# Patient Record
Sex: Female | Born: 1995 | Hispanic: Yes | Marital: Married | Smoking: Never smoker
Health system: Southern US, Community
[De-identification: ages and names within clinical notes are randomized; demographics above are authoritative.]

---

## 2021-04-19 ENCOUNTER — Emergency Department (HOSPITAL_COMMUNITY)
Admission: EM | Admit: 2021-04-19 | Discharge: 2021-04-20 | Disposition: A | Discharge status: Home / Self Care | Payer: No Typology Code available for payment source | Attending: Emergency Medicine | Admitting: Emergency Medicine

## 2021-04-19 ENCOUNTER — Encounter (HOSPITAL_COMMUNITY): Payer: Self-pay | Admitting: Emergency Medicine

## 2021-04-19 ENCOUNTER — Other Ambulatory Visit: Payer: Self-pay

## 2021-04-19 DIAGNOSIS — R109 Unspecified abdominal pain: Secondary | ICD-10-CM | POA: Insufficient documentation

## 2021-04-19 LAB — I-STAT BETA HCG BLOOD, ED (MC, WL, AP ONLY): I-stat hCG, quantitative: <5 m[IU]/mL (ref <5)

## 2021-04-19 LAB — URINALYSIS, ROUTINE W REFLEX MICROSCOPIC
Bacteria, UA: NONE SEEN
Bilirubin Urine: NEGATIVE
Glucose, UA: NEGATIVE mg/dL
Ketones, ur: NEGATIVE mg/dL
Leukocytes,Ua: NEGATIVE
Nitrite: NEGATIVE
Protein, ur: 30 mg/dL — AB
Specific Gravity, Urine: 1.014 (ref 1.005–1.030)
pH: 8 (ref 5.0–8.0)

## 2021-04-19 LAB — CBC WITH DIFFERENTIAL/PLATELET
Abs Immature Granulocytes: 0.01 10*3/uL (ref 0.00–0.07)
Basophils Absolute: 0.1 10*3/uL (ref 0.0–0.1)
Basophils Relative: 1 %
Eosinophils Absolute: 0 10*3/uL (ref 0.0–0.5)
Eosinophils Relative: 1 %
HCT: 29.2 % — ABNORMAL LOW (ref 36.0–46.0)
Hemoglobin: 8.4 g/dL — ABNORMAL LOW (ref 12.0–15.0)
Immature Granulocytes: 0 %
Lymphocytes Relative: 34 %
Lymphs Abs: 2 10*3/uL (ref 0.7–4.0)
MCH: 20.3 pg — ABNORMAL LOW (ref 26.0–34.0)
MCHC: 28.8 g/dL — ABNORMAL LOW (ref 30.0–36.0)
MCV: 70.5 fL — ABNORMAL LOW (ref 80.0–100.0)
Monocytes Absolute: 0.5 10*3/uL (ref 0.1–1.0)
Monocytes Relative: 9 %
Neutro Abs: 3.3 10*3/uL (ref 1.7–7.7)
Neutrophils Relative %: 55 %
Platelets: 236 10*3/uL (ref 150–400)
RBC: 4.14 MIL/uL (ref 3.87–5.11)
RDW: 17.9 % — ABNORMAL HIGH (ref 11.5–15.5)
WBC: 5.9 10*3/uL (ref 4.0–10.5)
nRBC: 0 % (ref 0.0–0.2)

## 2021-04-19 LAB — COMPREHENSIVE METABOLIC PANEL
ALT: 9 U/L (ref 0–44)
AST: 17 U/L (ref 15–41)
Albumin: 4.1 g/dL (ref 3.5–5.0)
Alkaline Phosphatase: 69 U/L (ref 38–126)
Anion gap: 6 (ref 5–15)
BUN: 9 mg/dL (ref 6–20)
CO2: 26 mmol/L (ref 22–32)
Calcium: 8.8 mg/dL — ABNORMAL LOW (ref 8.9–10.3)
Chloride: 106 mmol/L (ref 98–111)
Creatinine, Ser: 0.69 mg/dL (ref 0.44–1.00)
GFR, Estimated: >60 mL/min (ref >60)
Glucose, Bld: 85 mg/dL (ref 70–99)
Potassium: 3.9 mmol/L (ref 3.5–5.1)
Sodium: 138 mmol/L (ref 135–145)
Total Bilirubin: 0.5 mg/dL (ref 0.3–1.2)
Total Protein: 7.6 g/dL (ref 6.5–8.1)

## 2021-04-19 NOTE — ED Provider Notes (Signed)
Emergency Medicine Provider Triage Evaluation Note  Cynthia Leach , a 25 y.o. female  was evaluated in triage.  Pt complains of abdominal pain.  States she began having dull abdominal pain around 2 PM this afternoon.  States that it was not until around 6 PM that she began having sharp abdominal pain that she describes as right flank pain that radiates to the right upper quadrant.  States that the sharp pain comes and goes.  States that turning to the right makes the pain worse.  Denies alleviating factors.  Denies nausea/vomiting/diarrhea, denies urinary symptoms, denies fevers.  Last bowel movement today.  Review of Systems  Positive: Flank pain, right upper quadrant pain Negative: Nausea vomiting  Physical Exam  BP 124/71 (BP Location: Left Arm)   Pulse 76   Temp 98.3 F (36.8 C) (Oral)   Resp 16   Ht 5' (1.524 m)   Wt 70.3 kg   SpO2 100%   BMI 30.27 kg/m  Gen:   Awake, no distress   Resp:  Normal effort  MSK:   Moves extremities without difficulty  Other:  Abdomen is soft and nontender. + Bowel sounds.  Negative Murphy's, negative McBurney's, negative Rovsing's.  Positive CVA tenderness on the right.  Negative CVA tenderness on the left.  Medical Decision Making  Medically screening exam initiated at 9:40 PM.  Appropriate orders placed.  Cynthia Leach was informed that the remainder of the evaluation will be completed by another provider, this initial triage assessment does not replace that evaluation, and the importance of remaining in the ED until their evaluation is complete.     Cristopher Peru, PA-C 04/19/21 2143    Melene Plan, DO 04/19/21 2301

## 2021-04-19 NOTE — ED Triage Notes (Signed)
Patient here from home reporting right sided flank pain that started earlier today around 1-2pm. Denies n/v. Denies trouble urinating.

## 2021-04-20 ENCOUNTER — Emergency Department (HOSPITAL_COMMUNITY): Payer: No Typology Code available for payment source

## 2021-04-20 NOTE — Discharge Instructions (Signed)
Take ibuprofen 600 mg every 6 hours as needed for pain.  Return to the emergency department if you develop worsening pain, high fever, or other new and concerning symptoms. 

## 2021-04-20 NOTE — ED Provider Notes (Signed)
[facility]GENCY [facility]PT Provider Note   CSN: [id number] Arrival date & time: 04/19/21  2032     History Chief Complaint  Patient presents with   Flank Pain    Cynthia Leach is a 25 y.o. female.  Patient is a 25 year old female with no significant past medical history.  She presents today with complaints of flank pain.  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  She felt sudden onset of pain to the right flank and right side of her abdomen which initially made her feel short of breath.  The pain has come and gone throughout the day.  She denies any bowel or bladder complaints.  She denies chest pain or difficulty breathing.  She does report eating a burrito 1 hour prior to onset of symptoms.  The history is provided by the patient.  Flank Pain This is a new problem. Episode onset: 2 PM. The problem has been gradually worsening. Associated symptoms include abdominal pain. Nothing aggravates the symptoms. Nothing relieves the symptoms. She has tried nothing for the symptoms.      History reviewed. No pertinent past medical history.  There are no problems to display for this patient.   History reviewed. No pertinent surgical history.   OB History   No obstetric history on file.     No family history on file.  Social History   Tobacco Use   Smoking status: Never   Smokeless tobacco: Never  Vaping Use   Vaping Use: Some days  Substance Use Topics   Alcohol use: Not Currently    Home Medications Prior to Admission medications   Not on File    Allergies    Patient has no allergy information on record.  Review of Systems   Review of Systems  Gastrointestinal:  Positive for abdominal pain.  Genitourinary:  Positive for flank pain.  All other systems reviewed and are negative.  Physical Exam Updated Vital Signs BP 120/65   Pulse 71   Temp 98.3 F (36.8 C) (Oral)   Resp 16   Ht 5'  (1.524 m)   Wt 70.3 kg   SpO2 98%   BMI 30.27 kg/m   Physical Exam Vitals and nursing note reviewed.  Constitutional:      General: She is not in acute distress.    Appearance: She is well-developed. She is not diaphoretic.  HENT:     Head: Normocephalic and atraumatic.  Cardiovascular:     Rate and Rhythm: Normal rate and regular rhythm.     Heart sounds: No murmur heard.   No friction rub. No gallop.  Pulmonary:     Effort: Pulmonary effort is normal. No respiratory distress.     Breath sounds: Normal breath sounds. No wheezing.  Abdominal:     General: Bowel sounds are normal. There is no distension.     Palpations: Abdomen is soft.     Tenderness: There is abdominal tenderness. There is right CVA tenderness. There is no guarding or rebound.  Musculoskeletal:        General: Normal range of motion.     Cervical back: Normal range of motion and neck supple.  Skin:    General: Skin is warm and dry.  Neurological:     General: No focal deficit present.     Mental Status: She is alert and oriented to person, place, and time.    ED Results / Procedures / Treatments  Labs (all labs ordered are listed, but only abnormal results are displayed) Labs Reviewed  URINALYSIS, ROUTINE W REFLEX MICROSCOPIC - Abnormal; Notable for the following components:      Result Value   APPearance HAZY (*)    Hgb urine dipstick MODERATE (*)    Protein, ur 30 (*)    All other components within normal limits  COMPREHENSIVE METABOLIC PANEL - Abnormal; Notable for the following components:   Calcium 8.8 (*)    All other components within normal limits  CBC WITH DIFFERENTIAL/PLATELET - Abnormal; Notable for the following components:   Hemoglobin 8.4 (*)    HCT 29.2 (*)    MCV 70.5 (*)    MCH 20.3 (*)    MCHC 28.8 (*)    RDW 17.9 (*)    All other components within normal limits  I-STAT BETA HCG BLOOD, ED ([facility], [facility], [facility] ONLY)    EKG None  Radiology No results  found.  Procedures Procedures   Medications Ordered in ED Medications - No data to display  ED Course  I have reviewed the triage vital signs and the nursing notes.  Pertinent labs & imaging results that were available during my care of the patient were reviewed by me and considered in my medical decision making (see chart for details).    MDM Rules/Calculators/A&P  Patient presenting here with complaints of right-sided flank pain that began suddenly this afternoon while at work.  She appears comfortable and pain seems to be improving somewhat.  She does have microscopic hematuria, but no sign of UTI.  A CT scan was obtained showing no evidence of renal calculus or other acute pathology.  I suspect a musculoskeletal etiology and feel as though discharge with NSAIDs is appropriate.  Final Clinical Impression(s) / ED Diagnoses Final diagnoses:  None    Rx / DC Orders ED Discharge Orders     None        Geoffery Lyons, MD 04/20/21 0210

## 2022-09-17 IMAGING — CT CT RENAL STONE PROTOCOL
2 of 4 series · 16 of 46 positions shown, 18 images · non-contrast
Comparison: None.

CLINICAL DATA: Flank pain, kidney stone suspected. Right flank pain
starting earlier today. Difficulty urinating.

EXAM:
CT ABDOMEN AND PELVIS WITHOUT CONTRAST
TECHNIQUE: Multidetector CT imaging of the abdomen and pelvis was performed
following the standard protocol without IV contrast.

[Series 2: axial st · axial · 0.76mm/px · z∈[-460,-85]mm · 13 of 85 slices shown, 15 images]
[im 5/85  soft-tissue]
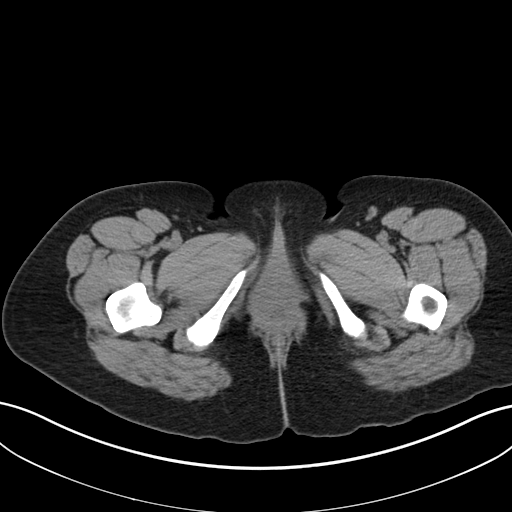
[im 5/85  bone]
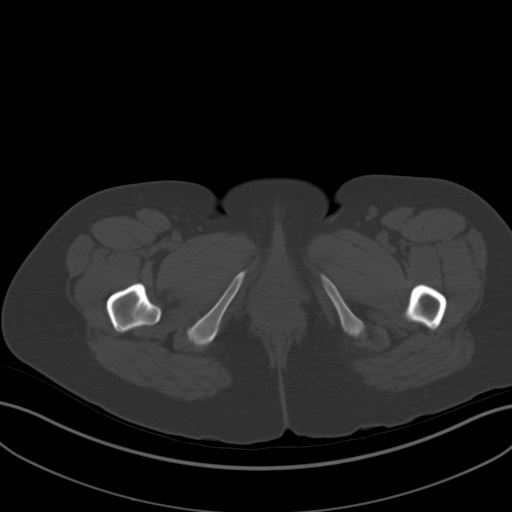
[im 10/85  soft-tissue]
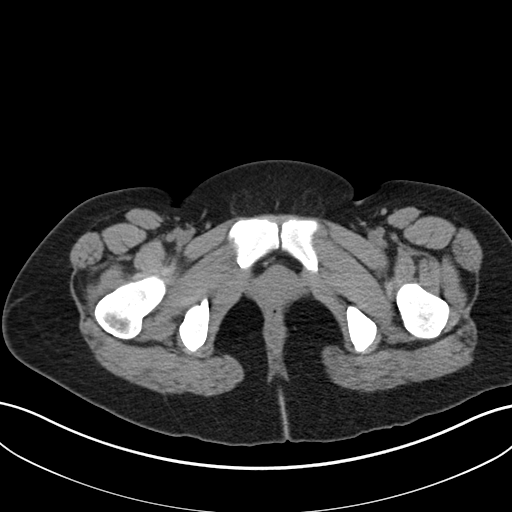
[im 19/85  soft-tissue]
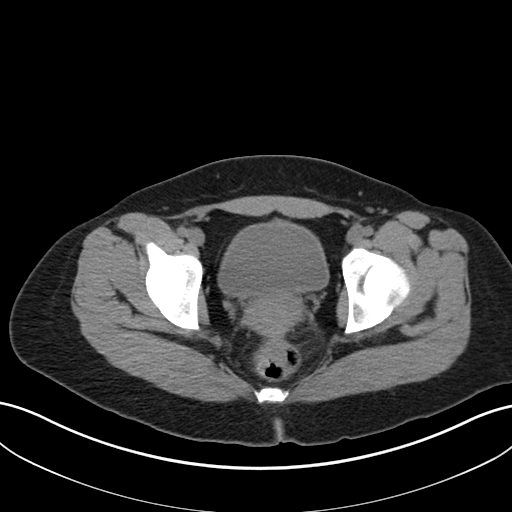
[im 24/85  soft-tissue]
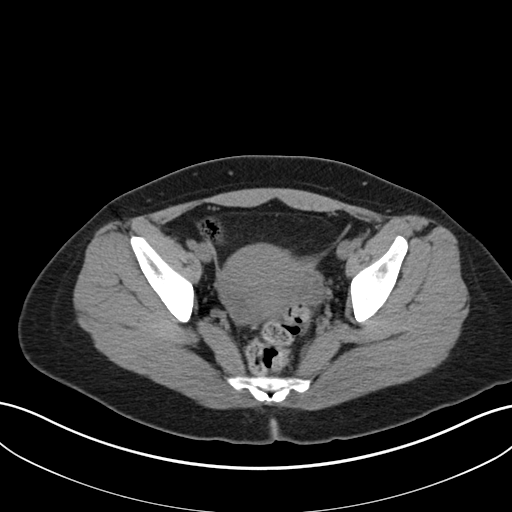
[im 29/85  soft-tissue]
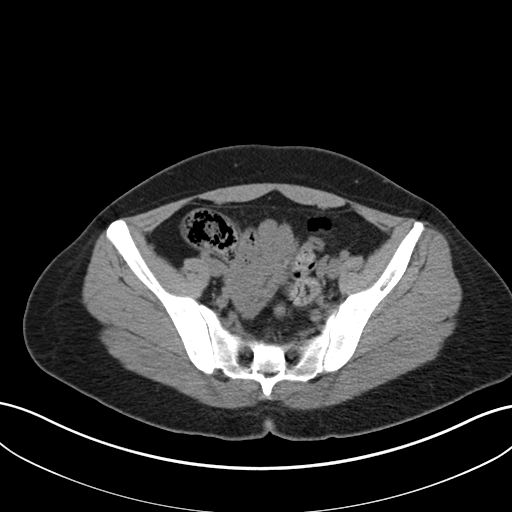
[im 38/85  soft-tissue]
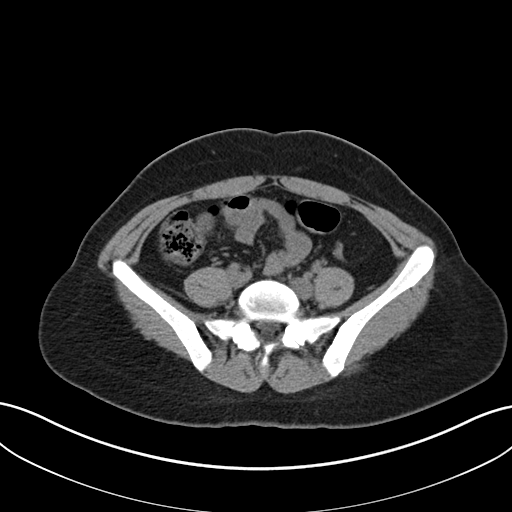
[im 43/85  soft-tissue]
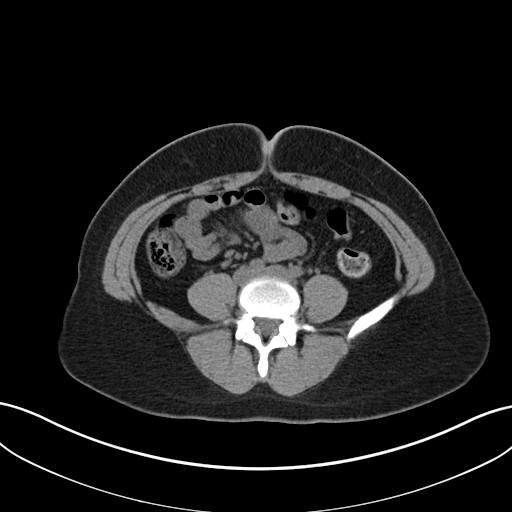
[im 47/85  soft-tissue]
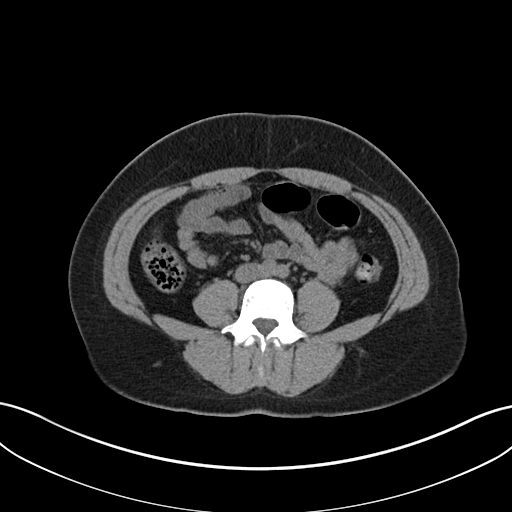
[im 57/85  soft-tissue]
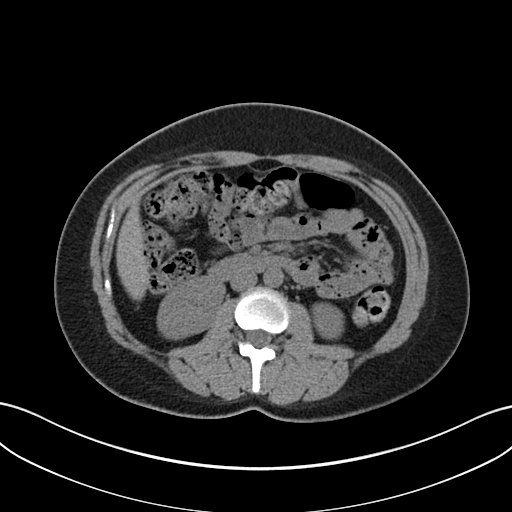
[im 57/85  bone]
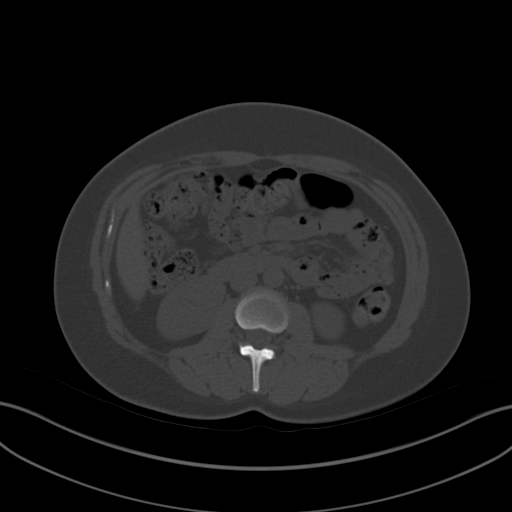
[im 61/85  soft-tissue]
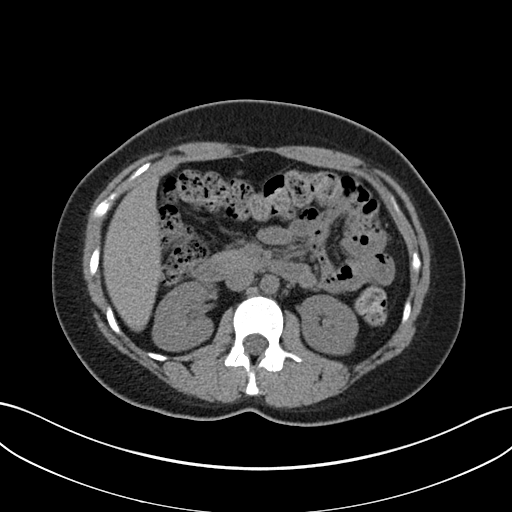
[im 66/85  soft-tissue]
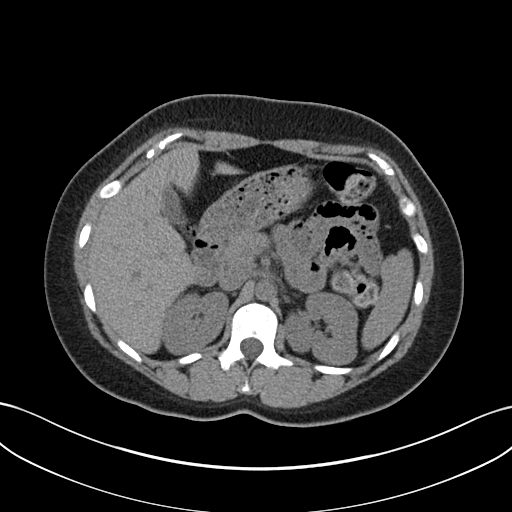
[im 75/85  soft-tissue]
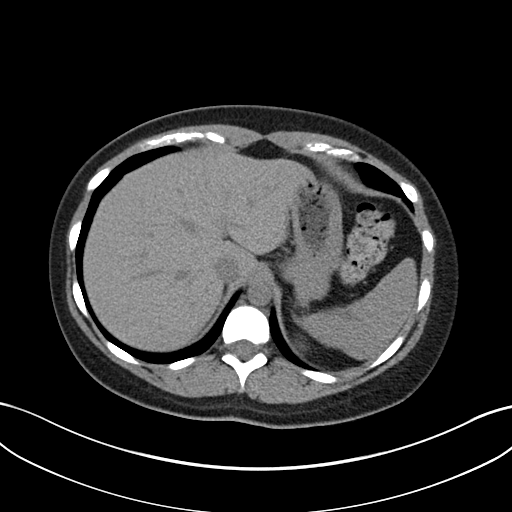
[im 80/85  soft-tissue]
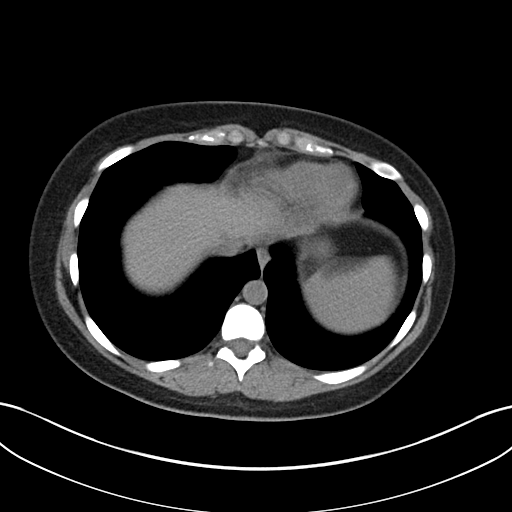

[Series 5: coronal · coronal · 0.73mm/px · 3 of 140 slices shown]
[im 47/140  soft-tissue]
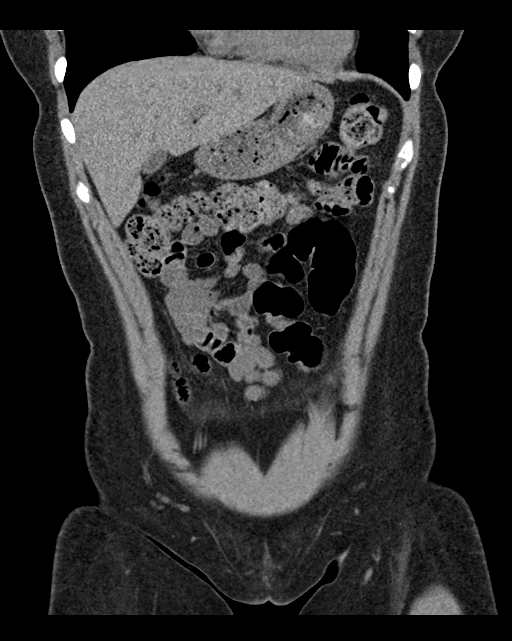
[im 62/140  soft-tissue]
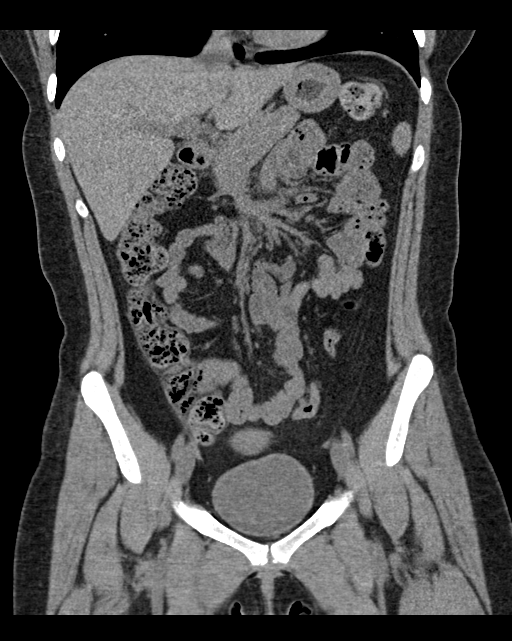
[im 78/140  soft-tissue]
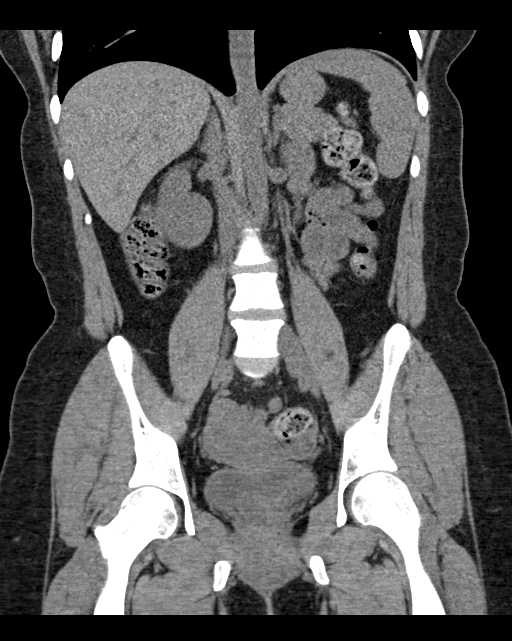

[16 of 46 positions shown; findings below may reference images not displayed]

FINDINGS: Lower chest: The lung bases are clear.

Hepatobiliary: No focal liver abnormality is seen. No gallstones,
gallbladder wall thickening, or biliary dilatation.

Pancreas: Unremarkable. No pancreatic ductal dilatation or
surrounding inflammatory changes.

Spleen: Normal in size without focal abnormality.

Adrenals/Urinary Tract: Adrenal glands are unremarkable. Kidneys are
normal, without renal calculi, focal lesion, or hydronephrosis.
Bladder is unremarkable.

Stomach/Bowel: Stomach is within normal limits. Appendix appears
normal. No evidence of bowel wall thickening, distention, or
inflammatory changes.

Vascular/Lymphatic: No significant vascular findings are present. No
enlarged abdominal or pelvic lymph nodes.

Reproductive: Uterus and bilateral adnexa are unremarkable.

Other: No abdominal wall hernia or abnormality. No abdominopelvic
ascites.

Musculoskeletal: No acute or significant osseous findings.
IMPRESSION: No renal or ureteral stone or obstruction. No acute abnormalities
demonstrated in the abdomen or pelvis.
